# Patient Record
Sex: Female | Born: 1990 | Race: Asian | Hispanic: No | Marital: Single | Smoking: Never smoker
Health system: Southern US, Community
[De-identification: ages and names within clinical notes are randomized; demographics above are authoritative.]

## PROBLEM LIST (undated history)

## (undated) DIAGNOSIS — Z789 Other specified health status: Secondary | ICD-10-CM

## (undated) HISTORY — PX: NO PAST SURGERIES: SHX2092

---

## 2014-05-28 ENCOUNTER — Other Ambulatory Visit (HOSPITAL_COMMUNITY): Payer: Self-pay | Admitting: Nurse Practitioner

## 2014-05-28 DIAGNOSIS — Z3689 Encounter for other specified antenatal screening: Secondary | ICD-10-CM

## 2014-05-28 LAB — OB RESULTS CONSOLE ANTIBODY SCREEN: ANTIBODY SCREEN: NEGATIVE

## 2014-05-28 LAB — OB RESULTS CONSOLE ABO/RH: RH TYPE: POSITIVE

## 2014-05-28 LAB — OB RESULTS CONSOLE RUBELLA ANTIBODY, IGM: Rubella: NON-IMMUNE/NOT IMMUNE

## 2014-05-28 LAB — OB RESULTS CONSOLE HIV ANTIBODY (ROUTINE TESTING): HIV: NONREACTIVE

## 2014-05-28 LAB — OB RESULTS CONSOLE RPR: RPR: NONREACTIVE

## 2014-05-28 LAB — OB RESULTS CONSOLE HEPATITIS B SURFACE ANTIGEN: Hepatitis B Surface Ag: NEGATIVE

## 2014-06-05 ENCOUNTER — Encounter (HOSPITAL_COMMUNITY): Payer: Self-pay

## 2014-06-05 ENCOUNTER — Ambulatory Visit (HOSPITAL_COMMUNITY)
Admission: RE | Admit: 2014-06-05 | Discharge: 2014-06-05 | Disposition: A | Discharge status: Home / Self Care | Payer: Medicaid Other | Source: Ambulatory Visit | Attending: Nurse Practitioner | Admitting: Nurse Practitioner

## 2014-06-05 DIAGNOSIS — Z3689 Encounter for other specified antenatal screening: Secondary | ICD-10-CM | POA: Insufficient documentation

## 2014-06-05 DIAGNOSIS — Z36 Encounter for antenatal screening of mother: Secondary | ICD-10-CM | POA: Diagnosis not present

## 2014-06-05 DIAGNOSIS — Z3A32 32 weeks gestation of pregnancy: Secondary | ICD-10-CM | POA: Insufficient documentation

## 2014-06-05 DIAGNOSIS — O0933 Supervision of pregnancy with insufficient antenatal care, third trimester: Secondary | ICD-10-CM | POA: Insufficient documentation

## 2014-07-02 LAB — OB RESULTS CONSOLE GC/CHLAMYDIA
CHLAMYDIA, DNA PROBE: NEGATIVE
Gonorrhea: NEGATIVE

## 2014-07-02 LAB — OB RESULTS CONSOLE GBS: GBS: NEGATIVE

## 2014-07-19 ENCOUNTER — Inpatient Hospital Stay (HOSPITAL_COMMUNITY)
Admission: AD | Admit: 2014-07-19 | Discharge: 2014-07-22 | DRG: 775 | Disposition: A | Discharge status: Home / Self Care | Payer: Medicaid Other | Source: Ambulatory Visit | Attending: Obstetrics & Gynecology | Admitting: Obstetrics & Gynecology

## 2014-07-19 ENCOUNTER — Inpatient Hospital Stay (HOSPITAL_COMMUNITY): Payer: Medicaid Other | Admitting: Anesthesiology

## 2014-07-19 ENCOUNTER — Encounter (HOSPITAL_COMMUNITY): Payer: Self-pay | Admitting: *Deleted

## 2014-07-19 ENCOUNTER — Inpatient Hospital Stay (HOSPITAL_COMMUNITY)
Admission: AD | Admit: 2014-07-19 | Discharge: 2014-07-19 | Disposition: A | Discharge status: Home / Self Care | Payer: Medicaid Other | Source: Ambulatory Visit | Attending: Obstetrics & Gynecology | Admitting: Obstetrics & Gynecology

## 2014-07-19 DIAGNOSIS — IMO0001 Reserved for inherently not codable concepts without codable children: Secondary | ICD-10-CM

## 2014-07-19 DIAGNOSIS — Z3A38 38 weeks gestation of pregnancy: Secondary | ICD-10-CM | POA: Diagnosis present

## 2014-07-19 DIAGNOSIS — O0933 Supervision of pregnancy with insufficient antenatal care, third trimester: Secondary | ICD-10-CM | POA: Diagnosis not present

## 2014-07-19 HISTORY — DX: Other specified health status: Z78.9

## 2014-07-19 LAB — CBC
HEMATOCRIT: 38.2 % (ref 36.0–46.0)
Hemoglobin: 12.5 g/dL (ref 12.0–15.0)
MCH: 29.2 pg (ref 26.0–34.0)
MCHC: 32.7 g/dL (ref 30.0–36.0)
MCV: 89.3 fL (ref 78.0–100.0)
Platelets: 289 10*3/uL (ref 150–400)
RBC: 4.28 MIL/uL (ref 3.87–5.11)
RDW: 17.4 % — ABNORMAL HIGH (ref 11.5–15.5)
WBC: 14.8 10*3/uL — ABNORMAL HIGH (ref 4.0–10.5)

## 2014-07-19 MED ORDER — OXYCODONE-ACETAMINOPHEN 5-325 MG PO TABS: 1.0000 | ORAL_TABLET | ORAL | Status: DC | PRN

## 2014-07-19 MED ORDER — CITRIC ACID-SODIUM CITRATE 334-500 MG/5ML PO SOLN: 30.0000 mL | ORAL | Status: DC | PRN

## 2014-07-19 MED ORDER — FENTANYL 2.5 MCG/ML BUPIVACAINE 1/10 % EPIDURAL INFUSION (WH - ANES)
14.0000 mL/h | INTRAMUSCULAR | Status: DC | PRN
  Administered 2014-07-20: 14 mL/h via EPIDURAL
  Filled 2014-07-19: qty 125

## 2014-07-19 MED ORDER — EPHEDRINE 5 MG/ML INJ
10.0000 mg | INTRAVENOUS | Status: DC | PRN
  Filled 2014-07-19: qty 2

## 2014-07-19 MED ORDER — LACTATED RINGERS IV SOLN
INTRAVENOUS | Status: DC
  Administered 2014-07-19 – 2014-07-20 (×3): via INTRAVENOUS

## 2014-07-19 MED ORDER — PHENYLEPHRINE 40 MCG/ML (10ML) SYRINGE FOR IV PUSH (FOR BLOOD PRESSURE SUPPORT)
80.0000 ug | PREFILLED_SYRINGE | INTRAVENOUS | Status: DC | PRN
  Filled 2014-07-19: qty 2

## 2014-07-19 MED ORDER — LIDOCAINE HCL (PF) 1 % IJ SOLN
30.0000 mL | INTRAMUSCULAR | Status: AC | PRN
  Administered 2014-07-20: 30 mL via SUBCUTANEOUS
  Filled 2014-07-19: qty 30

## 2014-07-19 MED ORDER — FENTANYL 2.5 MCG/ML BUPIVACAINE 1/10 % EPIDURAL INFUSION (WH - ANES)
INTRAMUSCULAR | Status: DC | PRN
  Administered 2014-07-19: 14 mL/h via EPIDURAL

## 2014-07-19 MED ORDER — FENTANYL 2.5 MCG/ML BUPIVACAINE 1/10 % EPIDURAL INFUSION (WH - ANES)
INTRAMUSCULAR | Status: AC
  Filled 2014-07-19: qty 125

## 2014-07-19 MED ORDER — ACETAMINOPHEN 325 MG PO TABS: 650.0000 mg | ORAL_TABLET | ORAL | Status: DC | PRN

## 2014-07-19 MED ORDER — DIPHENHYDRAMINE HCL 50 MG/ML IJ SOLN: 12.5000 mg | INTRAMUSCULAR | Status: DC | PRN

## 2014-07-19 MED ORDER — NALBUPHINE HCL 10 MG/ML IJ SOLN
10.0000 mg | INTRAMUSCULAR | Status: DC | PRN
  Administered 2014-07-19: 10 mg via INTRAMUSCULAR
  Filled 2014-07-19: qty 1

## 2014-07-19 MED ORDER — OXYTOCIN 40 UNITS IN LACTATED RINGERS INFUSION - SIMPLE MED
62.5000 mL/h | INTRAVENOUS | Status: DC
  Filled 2014-07-19: qty 1000

## 2014-07-19 MED ORDER — ONDANSETRON HCL 4 MG/2ML IJ SOLN: 4.0000 mg | Freq: Four times a day (QID) | INTRAMUSCULAR | Status: DC | PRN

## 2014-07-19 MED ORDER — OXYTOCIN BOLUS FROM INFUSION
500.0000 mL | INTRAVENOUS | Status: DC
  Administered 2014-07-20: 500 mL via INTRAVENOUS

## 2014-07-19 MED ORDER — LIDOCAINE HCL (PF) 1 % IJ SOLN
INTRAMUSCULAR | Status: DC | PRN
  Administered 2014-07-19: 5 mL
  Administered 2014-07-19: 3 mL
  Administered 2014-07-19: 5 mL

## 2014-07-19 MED ORDER — LACTATED RINGERS IV SOLN
500.0000 mL | Freq: Once | INTRAVENOUS | Status: AC
  Administered 2014-07-19: 500 mL via INTRAVENOUS

## 2014-07-19 MED ORDER — OXYCODONE-ACETAMINOPHEN 5-325 MG PO TABS: 2.0000 | ORAL_TABLET | ORAL | Status: DC | PRN

## 2014-07-19 MED ORDER — PHENYLEPHRINE 40 MCG/ML (10ML) SYRINGE FOR IV PUSH (FOR BLOOD PRESSURE SUPPORT)
PREFILLED_SYRINGE | INTRAVENOUS | Status: AC
  Filled 2014-07-19: qty 20

## 2014-07-19 MED ORDER — LACTATED RINGERS IV SOLN
500.0000 mL | INTRAVENOUS | Status: DC | PRN
  Administered 2014-07-20: 500 mL via INTRAVENOUS
  Administered 2014-07-20: 200 mL via INTRAVENOUS

## 2014-07-19 NOTE — MAU Note (Signed)
Pt presents to MAU with complaints of contractions since last night. Denies any vaginal bleeding or LOF 

## 2014-07-19 NOTE — Anesthesia Preprocedure Evaluation (Signed)

## 2014-07-19 NOTE — Discharge Instructions (Signed)
Braxton Hicks Contractions °Contractions of the uterus can occur throughout pregnancy. Contractions are not always a sign that you are in labor.  °WHAT ARE BRAXTON HICKS CONTRACTIONS?  °Contractions that occur before labor are called Braxton Hicks contractions, or false labor. Toward the end of pregnancy (32-34 weeks), these contractions can develop more often and may become more forceful. This is not true labor because these contractions do not result in opening (dilatation) and thinning of the cervix. They are sometimes difficult to tell apart from true labor because these contractions can be forceful and people have different pain tolerances. You should not feel embarrassed if you go to the hospital with false labor. Sometimes, the only way to tell if you are in true labor is for your health care provider to look for changes in the cervix. °If there are no prenatal problems or other health problems associated with the pregnancy, it is completely safe to be sent home with false labor and await the onset of true labor. °HOW CAN YOU TELL THE DIFFERENCE BETWEEN TRUE AND FALSE LABOR? °False Labor °· The contractions of false labor are usually shorter and not as hard as those of true labor.   °· The contractions are usually irregular.   °· The contractions are often felt in the front of the lower abdomen and in the groin.   °· The contractions may go away when you walk around or change positions while lying down.   °· The contractions get weaker and are shorter lasting as time goes on.   °· The contractions do not usually become progressively stronger, regular, and closer together as with true labor.   °True Labor °1. Contractions in true labor last 30-70 seconds, become very regular, usually become more intense, and increase in frequency.   °2. The contractions do not go away with walking.   °3. The discomfort is usually felt in the top of the uterus and spreads to the lower abdomen and low back.   °4. True labor can  be determined by your health care provider with an exam. This will show that the cervix is dilating and getting thinner.   °WHAT TO REMEMBER °· Keep up with your usual exercises and follow other instructions given by your health care provider.   °· Take medicines as directed by your health care provider.   °· Keep your regular prenatal appointments.   °· Eat and drink lightly if you think you are going into labor.   °· If Braxton Hicks contractions are making you uncomfortable:   °· Change your position from lying down or resting to walking, or from walking to resting.   °· Sit and rest in a tub of warm water.   °· Drink 2-3 glasses of water. Dehydration may cause these contractions.   °· Do slow and deep breathing several times an hour.   °WHEN SHOULD I SEEK IMMEDIATE MEDICAL CARE? °Seek immediate medical care if: °· Your contractions become stronger, more regular, and closer together.   °· You have fluid leaking or gushing from your vagina.   °· You have a fever.   °· You pass blood-tinged mucus.   °· You have vaginal bleeding.   °· You have continuous abdominal pain.   °· You have low back pain that you never had before.   °· You feel your baby's head pushing down and causing pelvic pressure.   °· Your baby is not moving as much as it used to.   °Document Released: 04/03/2005 Document Revised: 04/08/2013 Document Reviewed: 01/13/2013 °ExitCare® Patient Information ©2015 [employer]. This information is not intended to replace advice given to you by your health care   provider. Make sure you discuss any questions you have with your health care provider. ° °Fetal Movement Counts °Patient Name: __________________________________________________ Patient Due Date: ____________________ °Performing a fetal movement count is highly recommended in high-risk pregnancies, but it is good for every pregnant woman to do. Your health care provider may ask you to start counting fetal movements at 28 weeks of the pregnancy. Fetal  movements often increase: °· After eating a full meal. °· After physical activity. °· After eating or drinking something sweet or cold. °· At rest. °Pay attention to when you feel the baby is most active. This will help you notice a pattern of your baby's sleep and wake cycles and what factors contribute to an increase in fetal movement. It is important to perform a fetal movement count at the same time each day when your baby is normally most active.  °HOW TO COUNT FETAL MOVEMENTS °5. Find a quiet and comfortable area to sit or lie down on your left side. Lying on your left side provides the best blood and oxygen circulation to your baby. °6. Write down the day and time on a sheet of paper or in a journal. °7. Start counting kicks, flutters, swishes, rolls, or jabs in a 2-hour period. You should feel at least 10 movements within 2 hours. °8. If you do not feel 10 movements in 2 hours, wait 2-3 hours and count again. Look for a change in the pattern or not enough counts in 2 hours. °SEEK MEDICAL CARE IF: °· You feel less than 10 counts in 2 hours, tried twice. °· There is no movement in over an hour. °· The pattern is changing or taking longer each day to reach 10 counts in 2 hours. °· You feel the baby is not moving as he or she usually does. °Date: ____________ Movements: ____________ Start time: ____________ Finish time: ____________  °Date: ____________ Movements: ____________ Start time: ____________ Finish time: ____________ °Date: ____________ Movements: ____________ Start time: ____________ Finish time: ____________ °Date: ____________ Movements: ____________ Start time: ____________ Finish time: ____________ °Date: ____________ Movements: ____________ Start time: ____________ Finish time: ____________ °Date: ____________ Movements: ____________ Start time: ____________ Finish time: ____________ °Date: ____________ Movements: ____________ Start time: ____________ Finish time: ____________ °Date: ____________  Movements: ____________ Start time: ____________ Finish time: ____________  °Date: ____________ Movements: ____________ Start time: ____________ Finish time: ____________ °Date: ____________ Movements: ____________ Start time: ____________ Finish time: ____________ °Date: ____________ Movements: ____________ Start time: ____________ Finish time: ____________ °Date: ____________ Movements: ____________ Start time: ____________ Finish time: ____________ °Date: ____________ Movements: ____________ Start time: ____________ Finish time: ____________ °Date: ____________ Movements: ____________ Start time: ____________ Finish time: ____________ °Date: ____________ Movements: ____________ Start time: ____________ Finish time: ____________  °Date: ____________ Movements: ____________ Start time: ____________ Finish time: ____________ °Date: ____________ Movements: ____________ Start time: ____________ Finish time: ____________ °Date: ____________ Movements: ____________ Start time: ____________ Finish time: ____________ °Date: ____________ Movements: ____________ Start time: ____________ Finish time: ____________ °Date: ____________ Movements: ____________ Start time: ____________ Finish time: ____________ °Date: ____________ Movements: ____________ Start time: ____________ Finish time: ____________ °Date: ____________ Movements: ____________ Start time: ____________ Finish time: ____________  °Date: ____________ Movements: ____________ Start time: ____________ Finish time: ____________ °Date: ____________ Movements: ____________ Start time: ____________ Finish time: ____________ °Date: ____________ Movements: ____________ Start time: ____________ Finish time: ____________ °Date: ____________ Movements: ____________ Start time: ____________ Finish time: ____________ °Date: ____________ Movements: ____________ Start time: ____________ Finish time: ____________ °Date: ____________ Movements: ____________ Start time:  ____________ Finish time: ____________ °Date: ____________ Movements:   ____________ Start time: ____________ Finish time: ____________  °Date: ____________ Movements: ____________ Start time: ____________ Finish time: ____________ °Date: ____________ Movements: ____________ Start time: ____________ Finish time: ____________ °Date: ____________ Movements: ____________ Start time: ____________ Finish time: ____________ °Date: ____________ Movements: ____________ Start time: ____________ Finish time: ____________ °Date: ____________ Movements: ____________ Start time: ____________ Finish time: ____________ °Date: ____________ Movements: ____________ Start time: ____________ Finish time: ____________ °Date: ____________ Movements: ____________ Start time: ____________ Finish time: ____________  °Date: ____________ Movements: ____________ Start time: ____________ Finish time: ____________ °Date: ____________ Movements: ____________ Start time: ____________ Finish time: ____________ °Date: ____________ Movements: ____________ Start time: ____________ Finish time: ____________ °Date: ____________ Movements: ____________ Start time: ____________ Finish time: ____________ °Date: ____________ Movements: ____________ Start time: ____________ Finish time: ____________ °Date: ____________ Movements: ____________ Start time: ____________ Finish time: ____________ °Date: ____________ Movements: ____________ Start time: ____________ Finish time: ____________  °Date: ____________ Movements: ____________ Start time: ____________ Finish time: ____________ °Date: ____________ Movements: ____________ Start time: ____________ Finish time: ____________ °Date: ____________ Movements: ____________ Start time: ____________ Finish time: ____________ °Date: ____________ Movements: ____________ Start time: ____________ Finish time: ____________ °Date: ____________ Movements: ____________ Start time: ____________ Finish time: ____________ °Date:  ____________ Movements: ____________ Start time: ____________ Finish time: ____________ °Date: ____________ Movements: ____________ Start time: ____________ Finish time: ____________  °Date: ____________ Movements: ____________ Start time: ____________ Finish time: ____________ °Date: ____________ Movements: ____________ Start time: ____________ Finish time: ____________ °Date: ____________ Movements: ____________ Start time: ____________ Finish time: ____________ °Date: ____________ Movements: ____________ Start time: ____________ Finish time: ____________ °Date: ____________ Movements: ____________ Start time: ____________ Finish time: ____________ °Date: ____________ Movements: ____________ Start time: ____________ Finish time: ____________ °Document Released: 05/03/2006 Document Revised: 08/18/2013 Document Reviewed: 01/29/2012 °ExitCare® Patient Information ©2015 [employer]. This information is not intended to replace advice given to you by your health care provider. Make sure you discuss any questions you have with your health care provider. ° °

## 2014-07-19 NOTE — Anesthesia Procedure Notes (Signed)
Epidural Patient location during procedure: OB  Staffing Anesthesiologist: Phillips GroutARIGNAN, Lacharles Altschuler Performed by: anesthesiologist   Preanesthetic Checklist Completed: patient identified, site marked, surgical consent, pre-op evaluation, timeout performed, IV checked, risks and benefits discussed and monitors and equipment checked  Epidural Patient position: sitting Prep: DuraPrep Patient monitoring: heart rate, continuous pulse ox and blood pressure Approach: midline Location: L4-L5 Injection technique: LOR saline  Needle:  Needle type: Tuohy  Needle gauge: 17 G Needle length: 9 cm and 9 Needle insertion depth: 4 cm Catheter type: closed end flexible Catheter size: 20 Guage Catheter at skin depth: 8 cm Test dose: negative  Assessment Events: blood not aspirated, injection not painful, no injection resistance, negative IV test and no paresthesia  Additional Notes   Patient tolerated the insertion well without complications.

## 2014-07-19 NOTE — H&P (Signed)
Victoria Wells is a 24 y.o. female presenting for painful contractions worsening since last night. Got stronger throughout today. No bleeding, LOF. Normal FM. Maternal Medical History:  Reason for admission: Contractions.   Contractions: Onset was yesterday.   Frequency: regular.   Perceived severity is moderate.    Fetal activity: Perceived fetal activity is normal.   Last perceived fetal movement was within the past hour.    Prenatal complications: No bleeding, PIH or substance abuse.   Prenatal Complications - Diabetes: none.    OB History    Gravida Para Term Preterm AB TAB SAB Ectopic Multiple Living   1              Past Medical History  Diagnosis Date  . Medical history non-contributory    Past Surgical History  Procedure Laterality Date  . No past surgeries     Family History: family history is not on file. Social History:  reports that she has never smoked. She does not have any smokeless tobacco history on file. She reports that she does not drink alcohol or use illicit drugs.   Prenatal Transfer Tool  Maternal Diabetes: No Genetic Screening: Declined (late PNC) Maternal Ultrasounds/Referrals: Normal @31  weeks Fetal Ultrasounds or other Referrals:  None Maternal Substance Abuse:  No Significant Maternal Medications:  None Significant Maternal Lab Results:  Lab values include: Group B Strep negative Other Comments:  Late PNC (31 weeks)  ROS See HPI  Dilation: 4.5 Effacement (%): 80 Station: -2 Exam by:: K. WeissRN Blood pressure 114/77, pulse 81, temperature 98.2 F (36.8 C), temperature source Oral, resp. rate 16, last menstrual period 11/13/2013, SpO2 100 %. Maternal Exam:  Uterine Assessment: Contraction strength is moderate.  Contraction frequency is regular.   Abdomen: Patient reports no abdominal tenderness. Fetal presentation: vertex     Fetal Exam Fetal Monitor Review: Mode: ultrasound.   Baseline rate: 120.  Variability: moderate (6-25  bpm).   Pattern: accelerations present and no decelerations.    Fetal State Assessment: Category I - tracings are normal.     Physical Exam  Nursing note and vitals reviewed. Constitutional: She is oriented to person, place, and time. She appears well-developed and well-nourished. She appears distressed.  HENT:  Head: Normocephalic and atraumatic.  Eyes: Conjunctivae are normal. Right eye exhibits no discharge. Left eye exhibits no discharge.  Cardiovascular: Normal rate.   Respiratory: Effort normal. No respiratory distress.  GI: Soft. She exhibits no distension.  Neurological: She is alert and oriented to person, place, and time.  Skin: Skin is warm and dry. No rash noted. She is not diaphoretic.  Psychiatric: She has a normal mood and affect. Her behavior is normal.    Prenatal labs: ABO, Rh: B/Positive/-- (02/11 0000) Antibody: Negative (02/11 0000) Rubella: Nonimmune (02/11 0000) RPR: Nonreactive (02/11 0000)  HBsAg: Negative (02/11 0000)  HIV: Non-reactive (02/11 0000)  GBS: Negative (03/17 0000)   Assessment/Plan: Labor Plan #Labor: progressing well, expectant management #Pain: epidural on request #FWB: category 1 #ID: GBS neg     Beverely Lowdamo, Elena 07/19/2014, 9:33 PM

## 2014-07-20 ENCOUNTER — Encounter (HOSPITAL_COMMUNITY): Payer: Self-pay | Admitting: *Deleted

## 2014-07-20 DIAGNOSIS — O0933 Supervision of pregnancy with insufficient antenatal care, third trimester: Secondary | ICD-10-CM

## 2014-07-20 LAB — ABO/RH: ABO/RH(D): B POS

## 2014-07-20 LAB — TYPE AND SCREEN
ABO/RH(D): B POS
ANTIBODY SCREEN: NEGATIVE

## 2014-07-20 LAB — RPR: RPR Ser Ql: NONREACTIVE

## 2014-07-20 MED ORDER — SENNOSIDES-DOCUSATE SODIUM 8.6-50 MG PO TABS
2.0000 | ORAL_TABLET | ORAL | Status: DC
  Administered 2014-07-20 – 2014-07-21 (×2): 2 via ORAL
  Filled 2014-07-20 (×2): qty 2

## 2014-07-20 MED ORDER — DIPHENHYDRAMINE HCL 25 MG PO CAPS
25.0000 mg | ORAL_CAPSULE | Freq: Four times a day (QID) | ORAL | Status: DC | PRN
  Administered 2014-07-22: 25 mg via ORAL
  Filled 2014-07-20: qty 1

## 2014-07-20 MED ORDER — ACETAMINOPHEN 325 MG PO TABS: 650.0000 mg | ORAL_TABLET | ORAL | Status: DC | PRN

## 2014-07-20 MED ORDER — ONDANSETRON HCL 4 MG PO TABS: 4.0000 mg | ORAL_TABLET | ORAL | Status: DC | PRN

## 2014-07-20 MED ORDER — BENZOCAINE-MENTHOL 20-0.5 % EX AERO
1.0000 "application " | INHALATION_SPRAY | CUTANEOUS | Status: DC | PRN
  Administered 2014-07-20 – 2014-07-21 (×2): 1 via TOPICAL
  Filled 2014-07-20 (×2): qty 56

## 2014-07-20 MED ORDER — OXYCODONE-ACETAMINOPHEN 5-325 MG PO TABS
1.0000 | ORAL_TABLET | ORAL | Status: DC | PRN
  Administered 2014-07-20 – 2014-07-22 (×3): 1 via ORAL
  Filled 2014-07-20 (×3): qty 1

## 2014-07-20 MED ORDER — OXYCODONE-ACETAMINOPHEN 5-325 MG PO TABS
2.0000 | ORAL_TABLET | ORAL | Status: DC | PRN
Start: 2014-07-20 — End: 2014-07-22

## 2014-07-20 MED ORDER — WITCH HAZEL-GLYCERIN EX PADS: 1.0000 "application " | MEDICATED_PAD | CUTANEOUS | Status: DC | PRN

## 2014-07-20 MED ORDER — DIBUCAINE 1 % RE OINT: 1.0000 "application " | TOPICAL_OINTMENT | RECTAL | Status: DC | PRN

## 2014-07-20 MED ORDER — IBUPROFEN 600 MG PO TABS
600.0000 mg | ORAL_TABLET | Freq: Four times a day (QID) | ORAL | Status: DC
  Administered 2014-07-20 – 2014-07-22 (×8): 600 mg via ORAL
  Filled 2014-07-20 (×8): qty 1

## 2014-07-20 MED ORDER — ONDANSETRON HCL 4 MG/2ML IJ SOLN
4.0000 mg | INTRAMUSCULAR | Status: DC | PRN
Start: 2014-07-20 — End: 2014-07-22

## 2014-07-20 MED ORDER — TETANUS-DIPHTH-ACELL PERTUSSIS 5-2.5-18.5 LF-MCG/0.5 IM SUSP: 0.5000 mL | Freq: Once | INTRAMUSCULAR | Status: DC

## 2014-07-20 MED ORDER — ZOLPIDEM TARTRATE 5 MG PO TABS: 5.0000 mg | ORAL_TABLET | Freq: Every evening | ORAL | Status: DC | PRN

## 2014-07-20 MED ORDER — PRENATAL MULTIVITAMIN CH
1.0000 | ORAL_TABLET | Freq: Every day | ORAL | Status: DC
  Administered 2014-07-21 – 2014-07-22 (×2): 1 via ORAL
  Filled 2014-07-20 (×2): qty 1

## 2014-07-20 MED ORDER — SIMETHICONE 80 MG PO CHEW
80.0000 mg | CHEWABLE_TABLET | ORAL | Status: DC | PRN
Start: 2014-07-20 — End: 2014-07-22

## 2014-07-20 MED ORDER — LANOLIN HYDROUS EX OINT: TOPICAL_OINTMENT | CUTANEOUS | Status: DC | PRN

## 2014-07-20 NOTE — Anesthesia Postprocedure Evaluation (Signed)
  Anesthesia Post-op Note  Patient: Victoria Wells  Procedure(s) Performed: * No procedures listed *  Patient Location: Mother/Baby  Anesthesia Type:Epidural  Level of Consciousness: awake, alert , oriented and patient cooperative  Airway and Oxygen Therapy: Patient Spontanous Breathing  Post-op Pain: none  Post-op Assessment: Post-op Vital signs reviewed, Patient's Cardiovascular Status Stable, Respiratory Function Stable, Patent Airway, No headache, No backache, No residual numbness and No residual motor weakness  Post-op Vital Signs: Reviewed and stable  Last Vitals:  Filed Vitals:   07/20/14 1246  BP: 112/66  Pulse: 83  Temp:   Resp:     Complications: No apparent anesthesia complications

## 2014-07-20 NOTE — Lactation Note (Signed)
This note was copied from the chart of Victoria Wells. Lactation Consultation Note RN reports mom changed to bottle feeding and has a flat affect with questionable bonding.   Patient Name: Victoria Wells BJYNW'GToday's Date: 07/20/2014     Maternal Data    Feeding Feeding Type: Bottle Fed - Formula Nipple Type: Slow - flow  LATCH Score/Interventions                      Lactation Tools Discussed/Used     Consult Status      Shoptaw, Arvella MerlesJana Lynn 07/20/2014, 9:04 PM

## 2014-07-20 NOTE — Progress Notes (Signed)
Delivery Note At 11:14am a viable female was delivered via NSVD (Presentation: Occiput Posterior with compound right arm ;  ).  APGAR: 8, 9; weight 3391g  .   Placenta status: intact. Cord: 3 vessel with the following complications: loose nuchal x1 .   As performed by Dorathy KinsmanVirginia Avarae Zwart, Midwife: Anesthesia: epidural  Episiotomy:  no Lacerations:  2nd degree with b/l labial extensions Suture Repair: 2.0 vicryl Est. Blood Loss (mL):  400cc    Mom to postpartum.  Baby to Couplet care / Skin to Skin.   Will order CBC in setting of 2nd degree lac w/ increased blood-loss.  Delynn FlavinGottschalk, Ashly M, DO 07/20/2014, 12:23 PM  I performed the delivery of baby and placenta and repair and agree with above.  MilanVirginia Dominion Kathan, CNM 07/20/2014 1:24 PM

## 2014-07-21 LAB — CBC
HEMATOCRIT: 26.7 % — AB (ref 36.0–46.0)
Hemoglobin: 8.8 g/dL — ABNORMAL LOW (ref 12.0–15.0)
MCH: 29.3 pg (ref 26.0–34.0)
MCHC: 33 g/dL (ref 30.0–36.0)
MCV: 89 fL (ref 78.0–100.0)
PLATELETS: 254 10*3/uL (ref 150–400)
RBC: 3 MIL/uL — ABNORMAL LOW (ref 3.87–5.11)
RDW: 17.6 % — AB (ref 11.5–15.5)
WBC: 20.4 10*3/uL — ABNORMAL HIGH (ref 4.0–10.5)

## 2014-07-21 MED ORDER — MEASLES, MUMPS & RUBELLA VAC ~~LOC~~ INJ
0.5000 mL | INJECTION | Freq: Once | SUBCUTANEOUS | Status: AC
  Administered 2014-07-22: 0.5 mL via SUBCUTANEOUS
  Filled 2014-07-21 (×2): qty 0.5

## 2014-07-21 NOTE — Progress Notes (Signed)
Clinical Social Work Department PSYCHOSOCIAL ASSESSMENT - MATERNAL/CHILD 07/21/2014  Patient:  EVENY, ANASTAS  Account Number:  [id number]  Admit Date:  07/19/2014  Ardine Eng Name:   Corene Cornea   Clinical Social Worker:  Lucita Ferrara, CLINICAL SOCIAL WORKER   Date/Time:  07/21/2014 10:00 AM  Date Referred:  07/20/2014   Referral source  Central Nursery     Referred reason  [facility]  Limited bonding with infant   I:  FAMILY / HOME ENVIRONMENT Child's legal guardian:  PARENT  Guardian - Name Guardian - Age Guardian - Address  [address] [place], [place] [address]  Nestor Ramp  same as above   Other support:   [name] reported that she lives with the FOB and the FOB's mother.  She stated that she feels supported by her family and believes that the [facility] will be helpful/supportive once she returns home.   II  PSYCHOSOCIAL DATA Information Source:  Patient Interview  Occupational hygienist Employment:   CSW did not assess.   Financial resources:  Medicaid If Medicaid - County:  [place] Other  Parc / Grade:  N/A Music therapist / Child Services Coordination / Early Interventions:   None reported  Cultural issues impacting care:   [name] is from [place] (arrived in 2012).  [name] stated that pregnancy and birthing practices are different between [place] and the [place].  [name] indicated that in [place], she would not hold the infant immediately, and stated that there are varying levels of direct infant care that is provided by the mother immediately after an infant is born. This is consistent with the nursing staff oberservations of limited bonding with the infant.   III  STRENGTHS Strengths  Adequate Resources  Home prepared for Child (including basic supplies)  Supportive family/friends   Strength comment:  [name] asked appropriate questions related to how to apply for Medicaid and how she will receive the social security  card.  IV  RISK FACTORS AND CURRENT PROBLEMS Current Problem:  YES   Risk Factor & Current Problem Patient Issue Family Issue Risk Factor / Current Problem Comment  Late Prenatal Care Y N [name] arrived late to prenatal care (after 28 weeks). She denied any barriers to accessing care.  Infant UDS is negative and MDS is pending.  Limited bonding with infant Y N Nursing staff is concerned about limited bonding.  [name] reported that she is excited to be a mother, and expressed lack of confidence in providing infant care.    V  SOCIAL WORK ASSESSMENT CSW received consult and met with [name] due to late prenatal care (after 28 weeks) and concerns about delayed/limited bonding between [name] and infant. [name] was lying in her bed, baby remained in the crib during the entire visit.  No interactions between [name] and infant were noted.  Nursing staff stated that the [name] has minimally held the infant, and were concerned about [name] not changing a diaper when needed despite infant crying.  There were additional concerns about the [name] having a flat affect.  During the [place] visit, [name] was noted to smile periodically.  She did smile when she discussed how it feels to hold the infant and to become a mother.    CSW assisted the [name] to process her experiences at [facility] and how she feels as she prepares to transition home.  [name] stated that the pregnancy and delivery is better "here" in comparison to [place].  She stated that there is increased access to medical care in the [place] which she stated is "better".  [name] acknowledged that there are differences in birthing practices and traditions after an infant is born.  [name] stated that she was not anticipating immediate need to provide skin-to-skin, and shared that it was difficult for her to hold the infant at first since the infant was "dirty".  She shared that there are varying levels of holding the infant once the infant is born in [place].  She stated that she has had some  friends who hold their infants "a lot", and other times, they interact with their infants minimally.  [name] stated that she feels "good" when she provides skin-to-skin, and is "excited" to become a mother.  She did discuss feeling unsure on how to provide infant care.  She stated that prior to Jason's arrival, she had never changed a diaper or fed an infant before.  [name] presents with low confidence in her parenting skills, and agreed to contact nursing staff with all her questions in order to increase confidence prior to discharge.  She denied any difficulties or barriers to bonding with the infant.  [name] confirmed that the [facility] will support her and help her provide infant care once she is discharged.  She stated that they have all necessary items for the infant.    [name] acknowledged late prenatal care.  When asked about any circumstances that may have contributed to late prenatal care, [name] denied presence of any factors that led to late entrance to care.  [name] verbalized understanding of the hospital drug screen policy, and denied any history of substance use.    [name] agreed to contact CSW if needs arise. She also agreed to contact nursing staff with infant care questions/concerns.    VI SOCIAL WORK PLAN Social Work Geneticist, molecular to [employer]   Type of pt/family education:   Hospital drug screen policy   If child protective services report - county:  N/A If child protective services report - date:  N/A Information/referral to community resources comment:   Retail banker   Other social work plan:   CSW to follow up as needed or upon [name] request.    CSW will monitor the MDS and will make a CPS report if positive for substance use.

## 2014-07-21 NOTE — Progress Notes (Signed)
UR chart review completed.  

## 2014-07-21 NOTE — Progress Notes (Signed)
Post Partum Day #1 Subjective:  Victoria Wells is a 24 y.o. G1P1001 3861w6d s/p NSVD.  No acute events overnight.  Pt denies problems with ambulating, voiding or po intake.  She denies nausea or vomiting.  Pain is well controlled.  She has had flatus. She has not had bowel movement.  Lochia Moderate.  Plan for birth control is oral contraceptives (estrogen/progesterone), oral progesterone-only contraceptive.  Method of Feeding: Bottle  Objective: Blood pressure 94/65, pulse 72, temperature 97.8 F (36.6 C), temperature source Oral, resp. rate 20, last menstrual period 11/13/2013, SpO2 100 %, unknown if currently breastfeeding.  Physical Exam:  General: alert, cooperative and no distress Lochia: normal flow (patient has just changed pad) Chest: CTAB, no increased WOB Heart: RRR no m/r/g Abdomen: +BS, soft, nontender Uterine Fundus: firm, well below the umbilicus DVT Evaluation: negative homan's, No evidence of DVT seen on physical exam. Extremities: WWP, no edema   Recent Labs  07/19/14 2138 07/21/14 0546  HGB 12.5 8.8*  HCT 38.2 26.7*    Assessment/Plan:  ASSESSMENT: Victoria Wells is a 24 y.o. G1P1001 2061w6d s/p NSVD  Plan for discharge tomorrow, Social Work consult and Contraception OCPs   LOS: 2 days   Delynn FlavinGottschalk, Ashly M, DO 07/21/2014, 7:10 AM   OB fellow attestation Post Partum Day 1 I have seen and examined this patient and agree with above documentation in the resident's note.   Victoria Wells is a 24 y.o. G1P1001 s/p NSVD.  Pt denies problems with ambulating, voiding or po intake. Pain is well controlled.  Plan for birth control is oral contraceptives (estrogen/progesterone).  Method of Feeding: bottle  PE:  BP 94/65 mmHg  Pulse 72  Temp(Src) 97.8 F (36.6 C) (Oral)  Resp 20  SpO2 100%  LMP 11/13/2013 (Exact Date)  Breastfeeding? Unknown Fundus firm  Plan for discharge: PPD#2  William DaltonMcEachern, Sanyah Molnar, MD 7:34 AM

## 2014-07-22 MED ORDER — NORETHINDRONE 0.35 MG PO TABS: 1.0000 | ORAL_TABLET | Freq: Every day | ORAL | Status: DC

## 2014-07-22 MED ORDER — IBUPROFEN 600 MG PO TABS: 600.0000 mg | ORAL_TABLET | Freq: Four times a day (QID) | ORAL | Status: DC

## 2014-07-22 NOTE — Progress Notes (Signed)
International interpretor phone lined used.

## 2014-07-22 NOTE — Discharge Instructions (Signed)
You have elected for an oral contraceptive pill for birth control.  You are being prescribed Micronor.  It is important that you take this medication at the SAME TIME EACH DAY.  Do NOT skip doses.  If you miss a dose you may become pregnant.  Follow up with your doctor at the health department in 6 weeks for follow up.  Non-Breastfeeding Wear a well-fitting bra for support.  Use ice packs to relieve discomfort from engorgement.  Avoid handling your breasts and do not express milk.  Non-breastfeeding engorgement will subside in 24-36 hours. Uterine Changes After pains, or cramping, are normal. This cramping means that the uterus is contracting to return to its non-pregnant size. The uterus takes 5-6 weeks to return to its non-pregnant size. Vaginal Discharge Usually lasts about 10 days to 4 weeks. The color will change from bright red to brownish to tan and will become less in amount and finally disappear.  Menstruation: your period will resume in approximately 6-8 weeks, unless breastfeeding. Activity Rest! Do not do heavy housework or heavy exercise for two weeks. Avoid driving for 1-2 weeks. Check with your doctor for limitations on activities if you have had a C-Section.  Avoid sexual activity, douching or tampons until your postpartum visit.  Pelvic Rest Pelvic rest is sometimes recommended for women when:   The placenta is partially or completely covering the opening of the cervix (placenta previa).  There is bleeding between the uterine wall and the amniotic sac in the first trimester (subchorionic hemorrhage).  The cervix begins to open without labor starting (incompetent cervix, cervical insufficiency).  The labor is too early (preterm labor). HOME CARE INSTRUCTIONS  Do not have sexual intercourse, stimulation, or an orgasm.  Do not use tampons, douche, or put anything in the vagina.  Do not lift anything over 10 pounds (4.5 kg).  Avoid strenuous activity or straining your  pelvic muscles. SEEK MEDICAL CARE IF:  You have any vaginal bleeding during pregnancy. Treat this as a potential emergency.  You have cramping pain felt low in the stomach (stronger than menstrual cramps).  You notice vaginal discharge (watery, mucus, or bloody).  You have a low, dull backache.  There are regular contractions or uterine tightening. SEEK IMMEDIATE MEDICAL CARE IF: You have vaginal bleeding and have placenta previa.  Document Released: 07/29/2010 Document Revised: 06/26/2011 Document Reviewed: 07/29/2010 [facility] Patient Information 2015 [employer], [place]. This information is not intended to replace advice given to you by your health care provider. Make sure you discuss any questions you have with your health care provider.

## 2014-07-22 NOTE — Discharge Summary (Signed)
Obstetric Discharge Summary Reason for Admission: onset of labor Prenatal Procedures: ultrasound Intrapartum Procedures: spontaneous vaginal delivery Postpartum Procedures: none Complications-Operative and Postpartum: vaginal laceration 2nd degree  At 11:14am a viable female was delivered via NSVD (Presentation: Occiput Posterior with compound right arm ; ). APGAR: 8, 9; weight 3391g  .  Placenta status: intact. Cord: 3 vessel with the following complications: loose nuchal x1 .   As performed by [name], Midwife: Anesthesia: epidural  Episiotomy:  no Lacerations:  2nd degree with b/l labial extensions Suture Repair: 2.0 vicryl Est. Blood Loss (mL):  400cc   Mom to postpartum. Baby to Couplet care / Skin to Skin.   Will order CBC in setting of 2nd degree lac w/ increased blood-loss.  Hospital Course:  Active Problems:   Active labor at term   Spontaneous vaginal delivery   Victoria Wells is a 24 y.o. G1P1001 s/p NSVD.  Patient was admitted for labor.  She has postpartum course that was uncomplicated including no problems with ambulating, PO intake, urination, pain, or bleeding. The pt feels ready to go home and  will be discharged with outpatient follow-up.   Today: No acute events overnight.  Pt denies problems with ambulating, voiding or po intake.  She denies nausea or vomiting.  Pain is well controlled.  She has had flatus. She has not had bowel movement.  Lochia Moderate.  Plan for birth control is  oral progesterone-only contraceptive.  Method of Feeding: Bottle  Physical Exam:  General: alert, cooperative, appears stated age and no distress Lochia: appropriate Uterine Fundus: firm, at the level of umbillicus DVT Evaluation: No evidence of DVT seen on physical exam. Negative Homan's sign. No cords or calf tenderness. No significant calf/ankle edema.  H/H: Lab Results  Component Value Date/Time   HGB 8.8* 07/21/2014 05:46 AM   HCT 26.7* 07/21/2014 05:46  AM    Discharge Diagnoses: Term Pregnancy-delivered  Discharge Information: Date: 07/22/2014 Activity: pelvic rest Diet: routine  Medications: PNV, Ibuprofen and Micronor Breast feeding:  No: Bottle feed Condition: stable Instructions: refer to handout Discharge to: home      Discharge Instructions    Discharge patient    Complete by:  As directed          Patient assessed by CSW.  Please see excerpt below.   "CSW received consult and met with [name] due to late prenatal care (after 28 weeks) and concerns about delayed/limited bonding between [name] and infant. [name] was lying in her bed, baby remained in the crib during the entire visit. No interactions between [name] and infant were noted. Nursing staff stated that the [name] has minimally held the infant, and were concerned about [name] not changing a diaper when needed despite infant crying. There were additional concerns about the [name] having a flat affect. During the CSW visit, [name] was noted to smile periodically. She did smile when she discussed how it feels to hold the infant and to become a mother.   CSW assisted the [name] to process her experiences at [facility] and how she feels as she prepares to transition home. [name] stated that the pregnancy and delivery is better "here" in comparison to [place]. She stated that there is increased access to medical care in the [place] which she stated is "better". [name] acknowledged that there are differences in birthing practices and traditions after an infant is born. [name] stated that she was not anticipating immediate need to provide skin-to-skin, and shared that it was difficult for   her to hold the infant at first since the infant was "dirty". She shared that there are varying levels of holding the infant once the infant is born in [place]. She stated that she has had some friends who hold their infants "a lot", and other times, they interact with their infants minimally. [name] stated that she  feels "good" when she provides skin-to-skin, and is "excited" to become a mother. She did discuss feeling unsure on how to provide infant care. She stated that prior to Jason's arrival, she had never changed a diaper or fed an infant before. [name] presents with low confidence in her parenting skills, and agreed to contact nursing staff with all her questions in order to increase confidence prior to discharge. She denied any difficulties or barriers to bonding with the infant. [name] confirmed that the PGM will support her and help her provide infant care once she is discharged. She stated that they have all necessary items for the infant.   [name] acknowledged late prenatal care. When asked about any circumstances that may have contributed to late prenatal care, [name] denied presence of any factors that led to late entrance to care. [name] verbalized understanding of the hospital drug screen policy, and denied any history of substance use.   [name] agreed to contact CSW if needs arise. She also agreed to contact nursing staff with infant care questions/concerns."  No barriers to discharge.     Medication List    TAKE these medications        ibuprofen 600 MG tablet  Commonly known as:  ADVIL,MOTRIN  Take 1 tablet (600 mg total) by mouth every 6 (six) hours.     norethindrone 0.35 MG tablet  Commonly known as:  ORTHO MICRONOR  Take 1 tablet (0.35 mg total) by mouth daily.     prenatal multivitamin Tabs tablet  Take 1 tablet by mouth daily at 12 noon.       Follow-up Information    Follow up with [facility] [place].   Why:  6 weeks   Contact information:   [address] [place] [address] [phone]      Gottschalk, Ashly M, DO 07/22/2014,11:31 AM    

## 2015-08-02 ENCOUNTER — Ambulatory Visit: Payer: Self-pay | Admitting: Family Medicine

## 2015-09-12 IMAGING — US US OB COMP +14 WK
2 series · 12 of 28 positions shown · non-contrast
Comparison: none

[Series 1: us ob comp +14 wk mfm · 1 of 6 slices shown (1 of 2)]
[im 6/6]
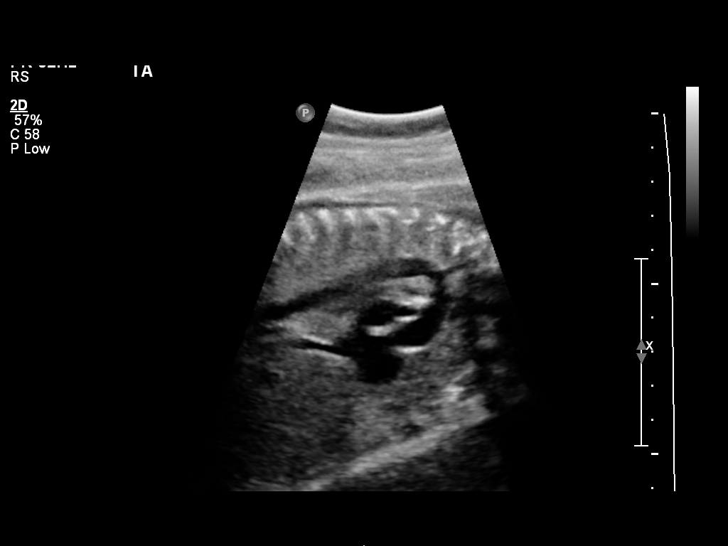

[Series 1: us ob comp +14 wk mfm · 11 of 77 slices shown (2 of 2)]
[im 4/77]
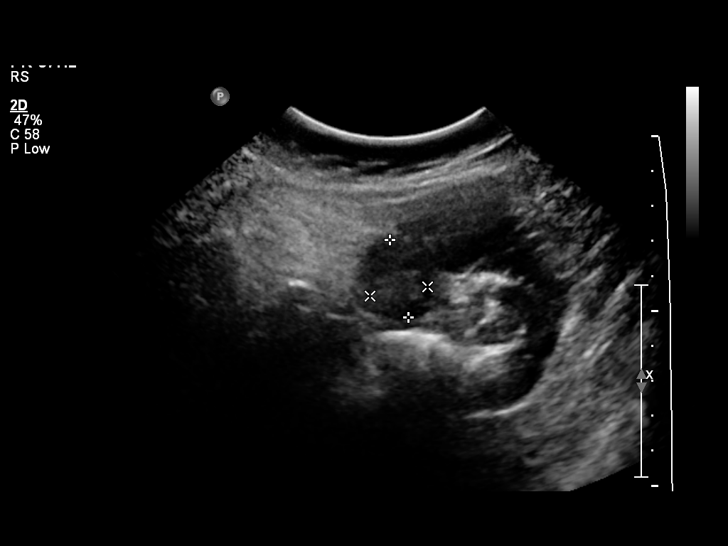
[im 10/77]
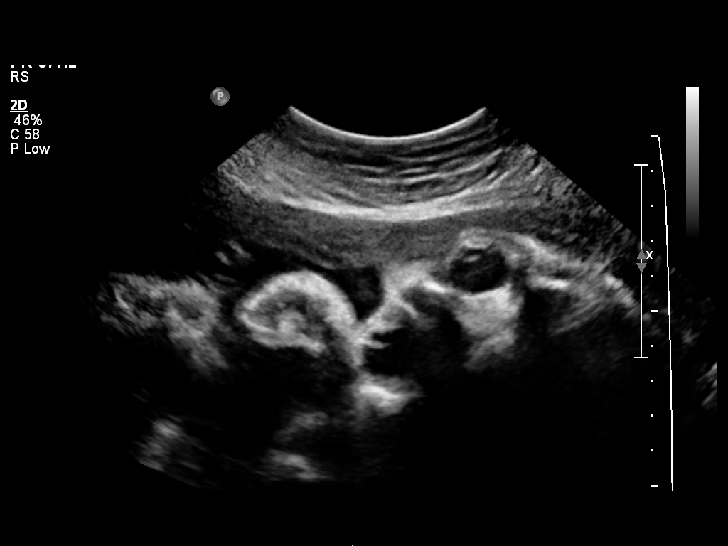
[im 19/77]
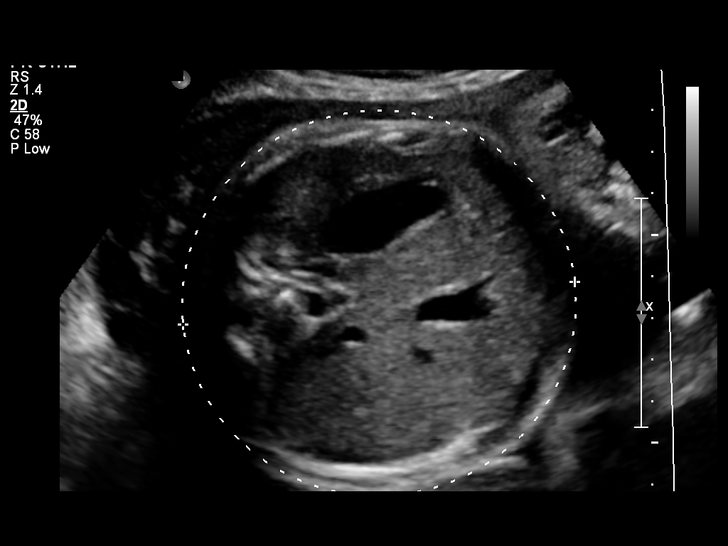
[im 25/77]
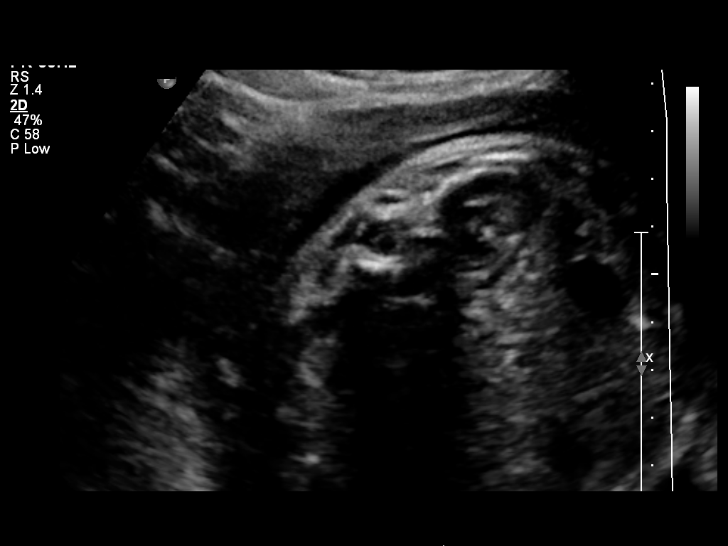
[im 31/77]
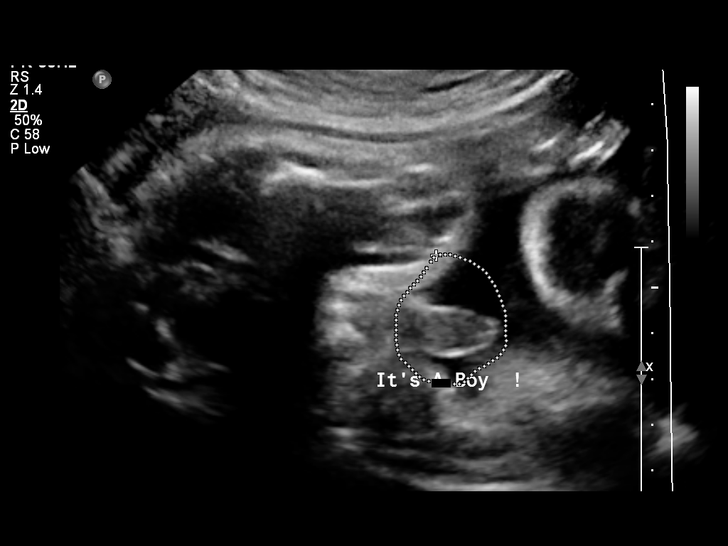
[im 40/77]
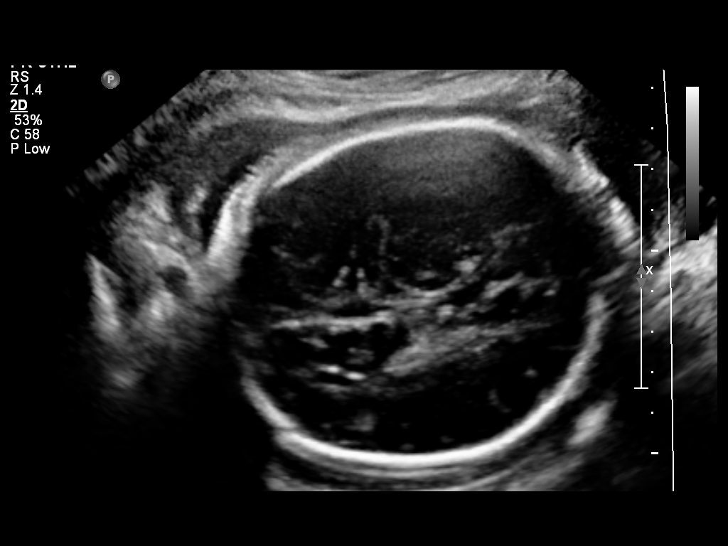
[im 46/77]
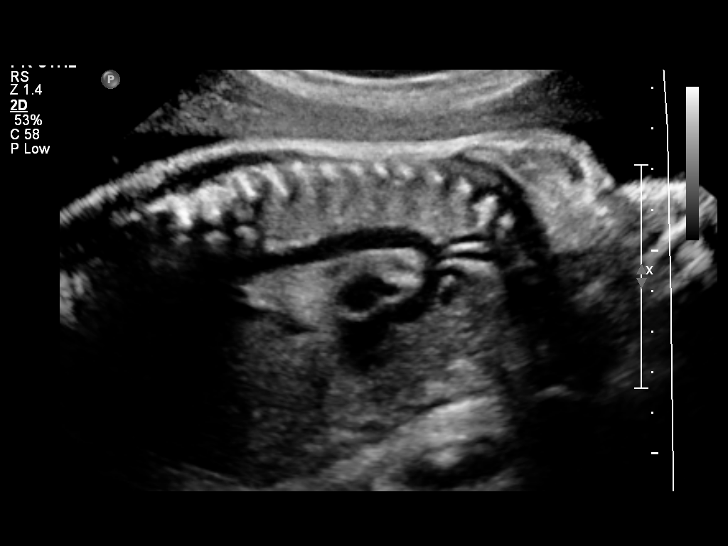
[im 52/77]
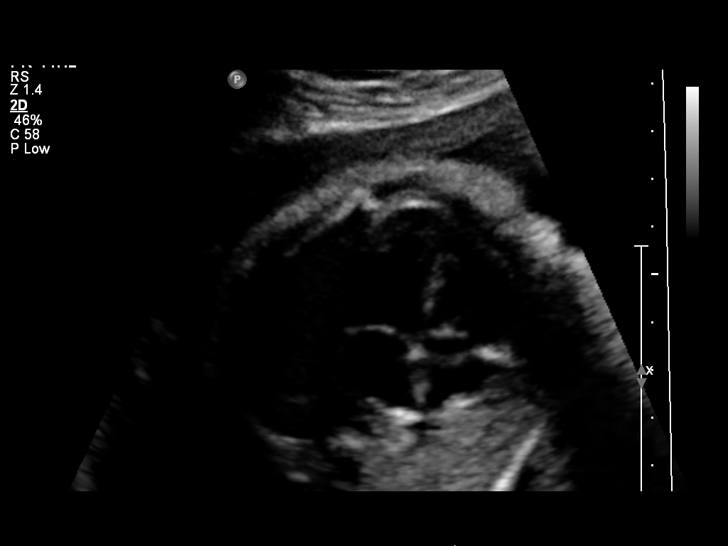
[im 61/77]
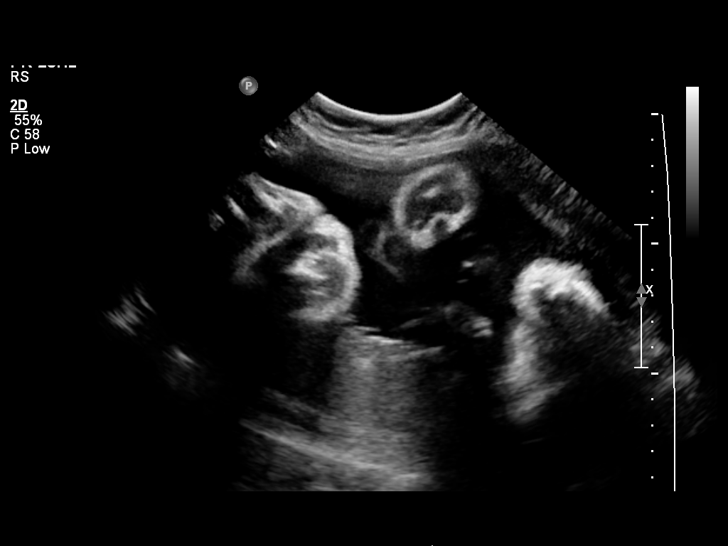
[im 67/77]
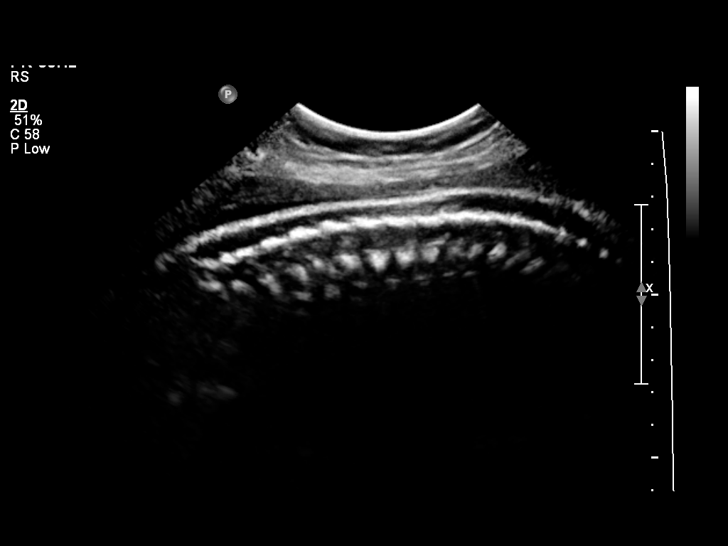
[im 73/77]
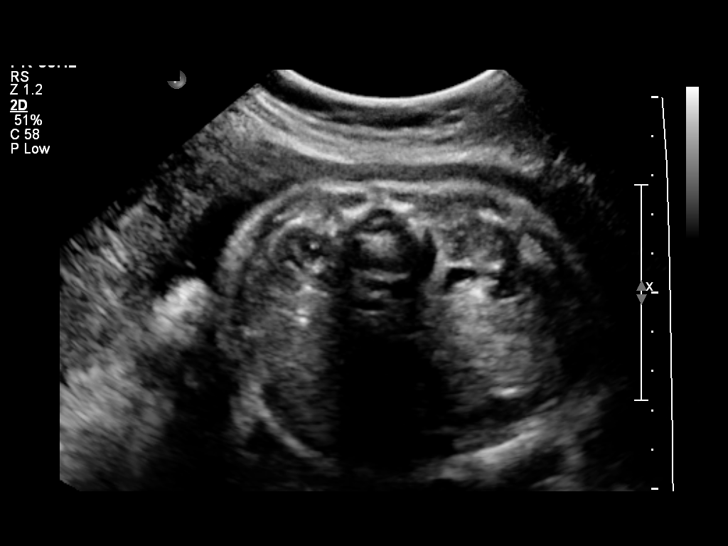

[12 of 28 positions shown; findings below may reference images not displayed]

OBSTETRICS REPORT
                      (Signed Final 06/05/2014 [DATE])

Service(s) Provided

 US OB COMP + 14 WK                                    76805.1
Indications

 32 weeks gestation of pregnancy
 No or Little Prenatal Care
 Basic anatomic survey                                 z36
Fetal Evaluation

 Num Of Fetuses:    1
 Fetal Heart Rate:  147                          bpm
 Cardiac Activity:  Observed
 Presentation:      Cephalic
 Placenta:          Posterior, above cervical
                    os
 P. Cord            Not well visualized
 Insertion:

 Amniotic Fluid
 AFI FV:      Subjectively within normal limits
 AFI Sum:     8.72    cm        7  %Tile     Larg Pckt:    3.18  cm
 RUQ:   1.81    cm   RLQ:    3.04   cm    LUQ:   3.18    cm   LLQ:    0.69   cm
Biometry

 BPD:     81.8  mm     G. Age:  32w 6d                CI:        77.01   70 - 86
                                                      FL/HC:      20.4   19.1 -

 HC:     295.2  mm     G. Age:  32w 4d       20  %    HC/AC:      1.01   0.96 -

 AC:       291  mm     G. Age:  33w 1d       69  %    FL/BPD:     73.5   71 - 87
 FL:      60.1  mm     G. Age:  31w 2d       13  %    FL/AC:      20.7   20 - 24

 Est. FW:    4284  gm      4 lb 6 oz     58  %
Gestational Age

 LMP:           29w 1d        Date:  11/13/13                 EDD:   08/20/14
 U/S Today:     32w 3d                                        EDD:   07/28/14
 Best:          32w 3d     Det. By:  U/S (06/05/14)           EDD:   07/28/14
Anatomy

 Cranium:          Appears normal         Aortic Arch:      Appears normal
 Fetal Cavum:      Appears normal         Ductal Arch:      Not well visualized
 Ventricles:       Appears normal         Diaphragm:        Appears normal
 Choroid Plexus:   Appears normal         Stomach:          Appears normal, left
                                                            sided
 Cerebellum:       Appears normal         Abdomen:          Appears normal
 Posterior Fossa:  Appears normal         Abdominal Wall:   Not well visualized
 Nuchal Fold:      Not applicable (>20    Cord Vessels:     Appears normal (3
                   wks GA)                                  vessel cord)
 Face:             Orbits nl; profile not Kidneys:          Appear normal
                   well visualized
 Lips:             Appears normal         Bladder:          Appears normal
 Heart:            Appears normal         Spine:            Appears normal
                   (4CH, axis, and
                   situs)
 RVOT:             Appears normal         Lower             Visualized
                                          Extremities:
 LVOT:             Appears normal         Upper             Visualized
                                          Extremities:

 Other:  Male gender. Technically difficult due to advanced GA and fetal
         position.
Cervix Uterus Adnexa

 Cervix:       Not visualized (advanced GA >31wks)

 Left Ovary:    Size(cm) L: 1.58 x W: 1.25 x H: 0.89  Volume(cc):
 Right Ovary:   Size(cm) L: 2.27 x W: 1.46 x H: 1.66  Volume(cc):
Impression

 Single IUP at 32w 3d
 Late / limited prenatal care
 Normal fetal anatomic survey, although somewhat limited due
 to advanced gestational age
 Fetal growth is appropriate (58th %tile)
 Posterior placenta without previa
 Normal amniotic fluid volume
Recommendations

 Follow-up ultrasounds as clinically indicated.

 questions or concerns.

## 2024-02-13 ENCOUNTER — Ambulatory Visit: Payer: Self-pay | Admitting: Family Medicine

## 2024-02-14 ENCOUNTER — Ambulatory Visit: Payer: Self-pay | Admitting: Family Medicine

## 2024-02-19 DIAGNOSIS — Z113 Encounter for screening for infections with a predominantly sexual mode of transmission: Secondary | ICD-10-CM | POA: Diagnosis not present

## 2024-02-19 DIAGNOSIS — Z3042 Encounter for surveillance of injectable contraceptive: Secondary | ICD-10-CM | POA: Diagnosis not present

## 2024-02-19 DIAGNOSIS — Z3202 Encounter for pregnancy test, result negative: Secondary | ICD-10-CM | POA: Diagnosis not present

## 2024-04-02 ENCOUNTER — Ambulatory Visit: Admitting: Family Medicine

## 2024-04-03 NOTE — Progress Notes (Signed)
 " Subjective Patient ID: Victoria Wells is a 33 y.o. female.  Chief Complaint  Patient presents with   Cough    Patient complaining of cough and sore throat for three days. Having chest pain with only with coughing. No known fevers. Patient has taken Dyquil with little relief.     The following information was reviewed by members of the visit team:  Tobacco  Allergies  Meds  Med Hx  Surg Hx  OB Status  Fam Hx  Soc  Hx     HPI Patient is a 33 year old female who presents with 2 days of throat pain.  She has had a mild cough.  No rhinorrhea.  No fevers and chills.  She has pain with swallowing Review of Systems  Constitutional: Negative.   HENT:  Positive for sore throat.   Eyes: Negative.   Respiratory:  Positive for cough.   Cardiovascular: Negative.   Gastrointestinal: Negative.   Endocrine: Negative.   Genitourinary: Negative.   Musculoskeletal: Negative.   Skin: Negative.   Allergic/Immunologic: Negative.   Neurological: Negative.   Hematological: Negative.   Psychiatric/Behavioral: Negative.      Objective Physical Exam Constitutional:      Appearance: Normal appearance. She is normal weight.  HENT:     Head: Normocephalic and atraumatic.     Ears:     Comments: Patient has mild left ear effusion.  No erythema.  Normal right TM.  No perforations.  No signs of otitis media or otitis externa.  No mastoiditis    Nose: Nose normal.     Mouth/Throat:     Mouth: Mucous membranes are moist.     Pharynx: Oropharynx is clear.     Comments: Patient has erythema with exudates of bilateral tonsils.  No uvular shift.  No signs of PTA or RPA. Eyes:     Extraocular Movements: Extraocular movements intact.     Conjunctiva/sclera: Conjunctivae normal.     Pupils: Pupils are equal, round, and reactive to light.  Cardiovascular:     Rate and Rhythm: Normal rate and regular rhythm.     Pulses: Normal pulses.     Heart sounds: Normal heart sounds.  Pulmonary:     Effort:  Pulmonary effort is normal.     Breath sounds: Normal breath sounds.  Abdominal:     General: Abdomen is flat.     Tenderness: There is no abdominal tenderness.  Musculoskeletal:        General: Normal range of motion.     Cervical back: Normal range of motion and neck supple.  Skin:    General: Skin is warm and dry.  Neurological:     General: No focal deficit present.     Mental Status: She is alert and oriented to person, place, and time.  Psychiatric:        Mood and Affect: Mood normal.        Behavior: Behavior normal.     Assessment/Plan Diagnoses and all orders for this visit:  Strep pharyngitis  Other orders -     amoxicillin (AMOXIL) 500 mg capsule; Take 1 capsule (500 mg total) by mouth 2 (two) times a day for 10 days.     33 year old female presents with throat pain x 2 days.  She also had a mild cough.  Subjective fevers.  Swallowing secretions.  No voice changes.  Differential diagnosis: Strep pharyngitis, COVID-19, influenza, viral pharyngitis  Patient is positive for strep.  She has no signs of PTA or  RPA.  Her voice is clear.  She is swallowing secretions.  Will prescribe her antibiotics.  Education was given regarding symptomatic management.  Follow-up with PCP.  Urgent Care Disposition:  Follow up with PCP   Electronically signed: Lorane Esau Bob, PA-C 04/03/2024  10:18 AM   "

## 2024-04-22 ENCOUNTER — Ambulatory Visit: Payer: No Typology Code available for payment source | Admitting: Family Medicine

## 2024-04-22 ENCOUNTER — Encounter: Payer: Self-pay | Admitting: Family Medicine

## 2024-04-22 VITALS — BP 122/72 | HR 70 | Ht <= 58 in | Wt 165.4 lb

## 2024-04-22 DIAGNOSIS — K760 Fatty (change of) liver, not elsewhere classified: Secondary | ICD-10-CM

## 2024-04-22 DIAGNOSIS — K5900 Constipation, unspecified: Secondary | ICD-10-CM

## 2024-04-22 DIAGNOSIS — H0260 Xanthelasma of unspecified eye, unspecified eyelid: Secondary | ICD-10-CM

## 2024-04-22 DIAGNOSIS — E669 Obesity, unspecified: Secondary | ICD-10-CM

## 2024-04-22 DIAGNOSIS — D1722 Benign lipomatous neoplasm of skin and subcutaneous tissue of left arm: Secondary | ICD-10-CM

## 2024-04-22 DIAGNOSIS — K640 First degree hemorrhoids: Secondary | ICD-10-CM

## 2024-04-22 LAB — COMPREHENSIVE METABOLIC PANEL WITH GFR
ALT: 38 IU/L — ABNORMAL HIGH (ref 0–32)
AST: 23 IU/L (ref 0–40)
Albumin: 4.1 g/dL (ref 3.9–4.9)
Alkaline Phosphatase: 113 IU/L (ref 41–116)
BUN/Creatinine Ratio: 18 (ref 9–23)
BUN: 12 mg/dL (ref 6–20)
Bilirubin Total: 0.5 mg/dL (ref 0.0–1.2)
CO2: 20 mmol/L (ref 20–29)
Calcium: 9.2 mg/dL (ref 8.7–10.2)
Chloride: 106 mmol/L (ref 96–106)
Creatinine, Ser: 0.66 mg/dL (ref 0.57–1.00)
Globulin, Total: 3.6 g/dL (ref 1.5–4.5)
Glucose: 127 mg/dL — ABNORMAL HIGH (ref 70–99)
Potassium: 3.5 mmol/L (ref 3.5–5.2)
Sodium: 140 mmol/L (ref 134–144)
Total Protein: 7.7 g/dL (ref 6.0–8.5)
eGFR: 117 mL/min/1.73

## 2024-04-22 MED ORDER — POLYETHYLENE GLYCOL 3350 17 GM/SCOOP PO POWD: 17.0000 g | Freq: Two times a day (BID) | ORAL | 1 refills | Status: AC | PRN

## 2024-04-22 NOTE — Patient Instructions (Signed)
 VISIT SUMMARY:  Today, we discussed your ongoing lower abdominal pain and suspected hemorrhoids. We reviewed your symptoms, including the persistent pain since your C-section and the presence of hemorrhoids. We also discussed your history of fatty liver disease and the need for regular monitoring.  YOUR PLAN:  -CONSTIPATION: Constipation is when you have difficulty emptying your bowels, often associated with hard stools. To help relieve your symptoms, you should take Miralax  twice daily for two days, then once daily for one week. Drink at least 60 ounces of water daily to help the fiber work effectively. If your symptoms do not improve, we may need to do further imaging and possibly refer you to a specialist for a colonoscopy.  -GRADE I HEMORRHOIDS: Hemorrhoids are swollen veins in the lower part of your rectum and anus, which can cause pain and discomfort. Your hemorrhoids are likely caused by constipation. By treating your constipation, we aim to reduce the pressure causing the hemorrhoids. We discussed how constipation can lead to hemorrhoid formation.  -FATTY LIVER: Fatty liver disease is when fat builds up in your liver. We will continue to monitor your liver function with blood tests every six months. I have ordered blood tests to check your liver function, and if the results are abnormal, we may need to schedule an ultrasound.  INSTRUCTIONS:  Please follow the prescribed treatment for constipation and ensure you drink plenty of water. We will monitor your liver function with regular blood tests. If your symptoms persist or worsen, please contact our office for further evaluation.

## 2024-04-22 NOTE — Progress Notes (Signed)
 "  Name: Victoria Wells   Date of Visit: 04/22/2024   Date of last visit with me: Visit date not found   CHIEF COMPLAINT:  Chief Complaint  Patient presents with   Establish Care    New patient. Having some lower abdomin concerns. Has throat pain, no other symptoms. Concerns of possible hemorid.        HPI:  Discussed the use of AI scribe software for clinical note transcription with the patient, who gave verbal consent to proceed.  History of Present Illness   Victoria Wells is a 34 year old female who presents with chronic lower abdominal pain and suspected hemorrhoids.  She has been experiencing persistent lower abdominal pain since April 2022, following a cesarean section. The pain is described as a constant sharp pain, not influenced by eating, and has been ongoing for nearly four years. A CT scan performed approximately five months ago did not reveal any abnormalities, but the pain persists.  She reports soft stools and sometimes feels like her bowels are not completely emptied after bowel movements.  She suspects the presence of hemorrhoids, noting 'three or four bumps' that sometimes cause pain. She has used a cream prescribed by her primary care doctor, but it has not alleviated the symptoms.  Her past medical history includes fatty liver disease, confirmed by a previous CT scan, for which she undergoes blood tests every six months. Other organs were reported as normal.  She is originally from [place] and does not speak Hindi, although her husband does.         OBJECTIVE:       04/22/2024   10:24 AM  Depression screen PHQ 2/9  Decreased Interest 0  Down, Depressed, Hopeless 0  PHQ - 2 Score 0     BP Readings from Last 3 Encounters:  04/22/24 122/72  07/22/14 112/68  07/19/14 117/78    BP 122/72   Pulse 70   Ht 4' 10 (1.473 m)   Wt 165 lb 6.4 oz (75 kg)   SpO2 98%   BMI 34.57 kg/m    Physical Exam          Physical Exam Constitutional:       Appearance: Normal appearance.  Neurological:     General: No focal deficit present.     Mental Status: She is alert and oriented to person, place, and time. Mental status is at baseline.     ASSESSMENT/PLAN:   Assessment & Plan Grade I hemorrhoids  Constipation, unspecified constipation type  Fatty liver  Obesity (BMI 30-39.9)  Lipoma of left upper extremity  Xanthelasma    Assessment and Plan    Constipation Chronic abdominal pain post-C-section with incomplete evacuation suggests constipation, likely due to hard stool ball causing pressure and contributing to hemorrhoids. - Prescribed Miralax , take twice daily for two days, then once daily for one week. - Advised to drink at least 60 ounces of water daily. - Educated on the importance of hydration for fiber to work effectively. - If symptoms persist, will consider further imaging and referral to a specialist for colonoscopy.  Grade I hemorrhoids Hemorrhoids with occasional pain likely secondary to constipation-related pressure. - Address constipation as primary treatment for hemorrhoids. - Educated on the relationship between constipation and hemorrhoid formation.  Fatty liver Previous CT scan indicated fatty liver. Regular blood tests every six months to monitor liver function. - Ordered blood tests to assess liver function. - If liver function tests are abnormal, will consider scheduling an  ultrasound.         Aerabella Galasso A. Vita MD [facility] "

## 2024-04-23 LAB — THYROID CASCADE PROFILE: TSH: 1.45 u[IU]/mL (ref 0.450–4.500)

## 2024-04-23 LAB — LIPID PANEL
Chol/HDL Ratio: 3.6 ratio (ref 0.0–4.4)
Cholesterol, Total: 130 mg/dL (ref 100–199)
HDL: 36 mg/dL — ABNORMAL LOW
LDL Chol Calc (NIH): 81 mg/dL (ref 0–99)
Triglycerides: 62 mg/dL (ref 0–149)
VLDL Cholesterol Cal: 13 mg/dL (ref 5–40)

## 2024-04-23 LAB — HEMOGLOBIN A1C
Est. average glucose Bld gHb Est-mCnc: 163 mg/dL
Hgb A1c MFr Bld: 7.3 % — ABNORMAL HIGH (ref 4.8–5.6)

## 2024-05-02 ENCOUNTER — Ambulatory Visit: Admitting: Family Medicine

## 2024-05-02 VITALS — BP 112/84 | HR 82 | Ht 59.0 in | Wt 165.0 lb

## 2024-05-02 DIAGNOSIS — E119 Type 2 diabetes mellitus without complications: Secondary | ICD-10-CM

## 2024-05-02 DIAGNOSIS — R7989 Other specified abnormal findings of blood chemistry: Secondary | ICD-10-CM | POA: Diagnosis not present

## 2024-05-02 NOTE — Progress Notes (Signed)
" ° °  Name: Victoria Wells   Date of Visit: 05/02/24   Date of last visit with me: 04/22/2024   CHIEF COMPLAINT:  Chief Complaint  Patient presents with   Follow-up    Follow up on hemorrhoids.        HPI:  Discussed the use of AI scribe software for clinical note transcription with the patient, who gave verbal consent to proceed.  History of Present Illness   Victoria Wells is a 34 year old female who presents for management of newly diagnosed diabetes and hemorrhoids.  She has been diagnosed with diabetes following recent lab tests that showed elevated A1c levels. There is a family history of diabetes, with her mother also being diabetic.  She is experiencing significant discomfort from hemorrhoids, which have been painful and tender for the past two to three days. She describes the hemorrhoids as feeling 'hard and tender' when touched. She has been using Miralax  to manage constipation, taking it twice daily in the morning and evening, but has not been consistent with daily use.         OBJECTIVE:       04/22/2024   10:24 AM  Depression screen PHQ 2/9  Decreased Interest 0  Down, Depressed, Hopeless 0  PHQ - 2 Score 0     BP Readings from Last 3 Encounters:  05/02/24 112/84  04/22/24 122/72  07/22/14 112/68    BP 112/84   Pulse 82   Ht 4' 11 (1.499 m)   Wt 165 lb (74.8 kg)   SpO2 98%   BMI 33.33 kg/m    Physical Exam          Physical Exam Constitutional:      Appearance: Normal appearance.  Neurological:     General: No focal deficit present.     Mental Status: She is alert and oriented to person, place, and time. Mental status is at baseline.     ASSESSMENT/PLAN:   Assessment & Plan Type 2 diabetes mellitus without complication, without long-term current use of insulin (HCC)  Elevated LFTs    Assessment and Plan    Type 2 diabetes mellitus New diagnosis confirmed by elevated A1c. Weight is a contributing factor. Discussed weight loss  medication for glycemic control and weight reduction. Explained mechanism, potential delay in effects, and cost-effectiveness. - Initiated weight loss medication. - Reassess in three months.  Hemorrhoids with constipation Chronic hemorrhoids exacerbated by constipation. Emphasized daily Miralax  use, fluid intake, dietary fiber, and exercise. Surgery considered if conservative measures fail. - Continue daily Miralax . - Ensure at least 50 ounces of fluids daily. - Increase dietary fiber intake. - Engage in regular physical activity. - Consider surgical intervention if needed.  Obesity Contributing factor to diabetes. Discussed weight loss medication and lifestyle modifications including diet and exercise. - Initiated weight loss medication. - Encouraged lifestyle modifications.      I personally spent a total of 34 minutes in the care of the patient today including preparing to see the patient, getting/reviewing separately obtained history, performing a medically appropriate exam/evaluation, counseling and educating, placing orders, referring and communicating with other health care professionals, documenting clinical information in the EHR, independently interpreting results, communicating results, and coordinating care.    Ashantee Deupree A. Vita MD [facility] "
